# Patient Record
Sex: Female | Born: 1960 | Race: White | Hispanic: No | Marital: Married | Smoking: Former smoker
Health system: Southern US, Community
[De-identification: ages and names within clinical notes are randomized; demographics above are authoritative.]

## PROBLEM LIST (undated history)

## (undated) HISTORY — PX: ABDOMINAL HERNIA REPAIR: SHX539

---

## 1999-07-18 ENCOUNTER — Emergency Department (HOSPITAL_COMMUNITY): Admission: EM | Admit: 1999-07-18 | Discharge: 1999-07-18 | Payer: Self-pay | Admitting: Emergency Medicine

## 2001-06-26 ENCOUNTER — Other Ambulatory Visit: Admission: RE | Admit: 2001-06-26 | Discharge: 2001-06-26 | Payer: Self-pay | Admitting: Gynecology

## 2002-10-29 ENCOUNTER — Other Ambulatory Visit: Admission: RE | Admit: 2002-10-29 | Discharge: 2002-10-29 | Payer: Self-pay | Admitting: Gynecology

## 2003-04-09 ENCOUNTER — Encounter: Admission: RE | Admit: 2003-04-09 | Discharge: 2003-04-09 | Payer: Self-pay | Admitting: Family Medicine

## 2003-04-09 ENCOUNTER — Encounter: Payer: Self-pay | Admitting: Family Medicine

## 2004-03-26 ENCOUNTER — Encounter: Admission: RE | Admit: 2004-03-26 | Discharge: 2004-03-26 | Payer: Self-pay | Admitting: Family Medicine

## 2004-11-01 ENCOUNTER — Other Ambulatory Visit: Admission: RE | Admit: 2004-11-01 | Discharge: 2004-11-01 | Payer: Self-pay | Admitting: Gynecology

## 2005-01-28 IMAGING — CT CT HEAD WO/W CM
3 of 4 series · 17 of 30 positions shown, 19 images · IV contrast ([ID] OMNI 300)
Comparison: none

CLINICAL DATA: 42-year-old female with headaches and left facial weakness. 
CT HEAD WITH AND WITHOUT CONTRAST
TECHNIQUE: Pre- and post-contrast images were performed of the head in the axial plane with 100 cc Omnipaque 300 administered intravenous for the post-contrast scanning.  
  No acute mass effect, hemorrhage, herniation, hydrocephalus, midline shift, or extra-axial fluid collection.  The ventricles are symmetric.  The cisterns are patent.  Normal vascular enhancement.  The visualized sinuses and mastoid air cells are clear. 
IMPRESSION
No acute intracranial abnormality or abnormal enhancement.

[Series 2: brain · axial · 0.49mm/px · z∈[+69,+162]mm · 5 of 28 slices shown (1 of 3)]
[im 5/28  brain]
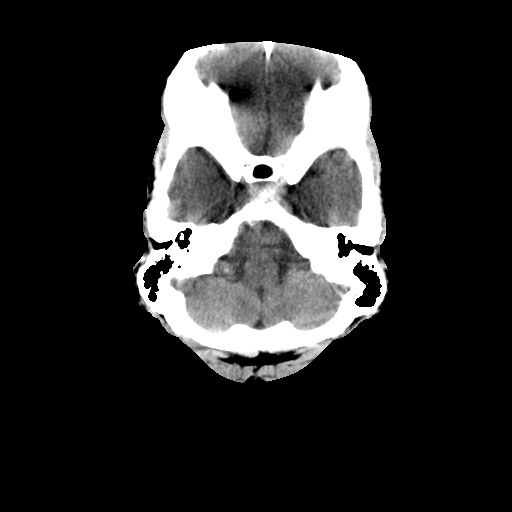
[im 10/28  brain]
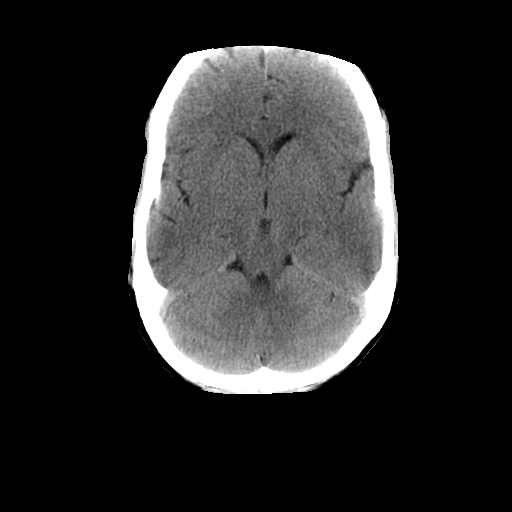
[im 14/28  brain]
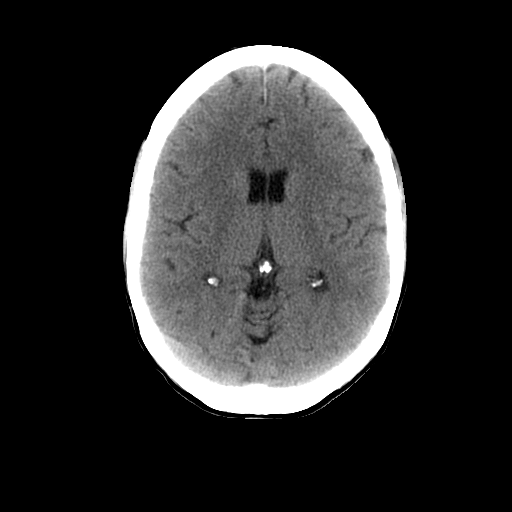
[im 19/28  brain]
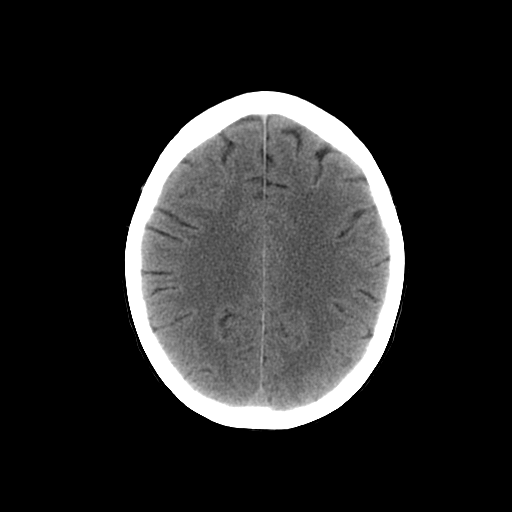
[im 23/28  brain]
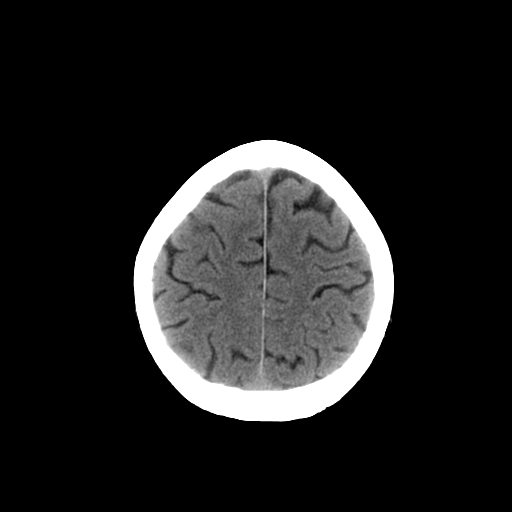

[Series 3: brain · axial · 0.49mm/px · z∈[+63,+168]mm · 6 of 28 slices shown, 8 images (2 of 3)]
[im 4/28  brain]
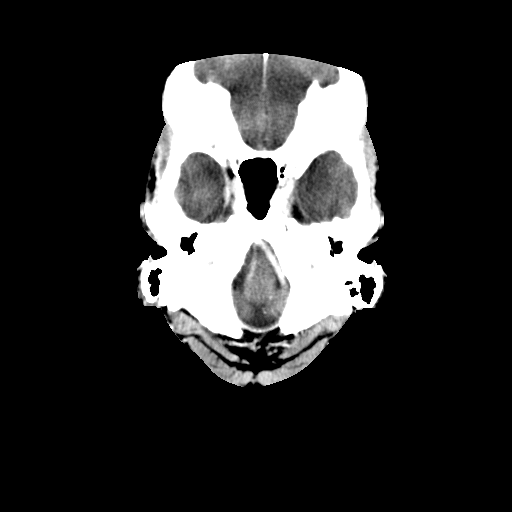
[im 4/28  bone]
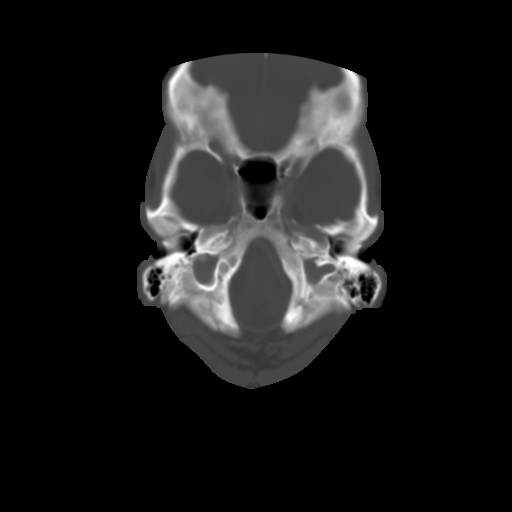
[im 8/28  brain]
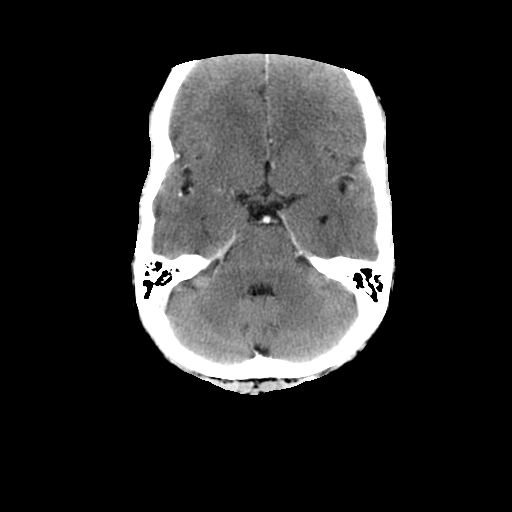
[im 12/28  brain]
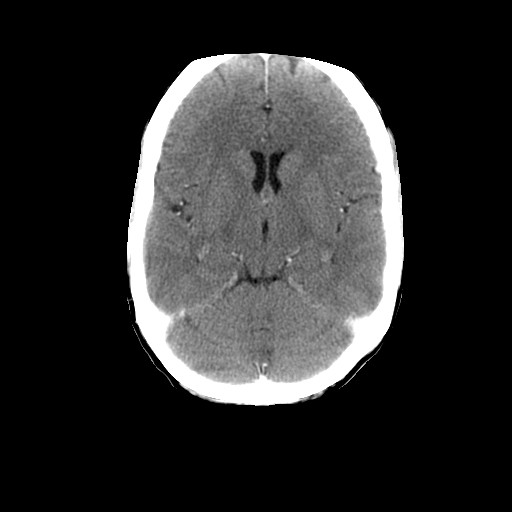
[im 16/28  brain]
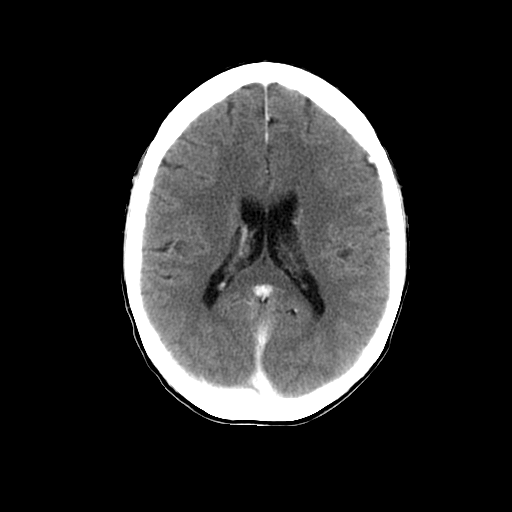
[im 20/28  brain]
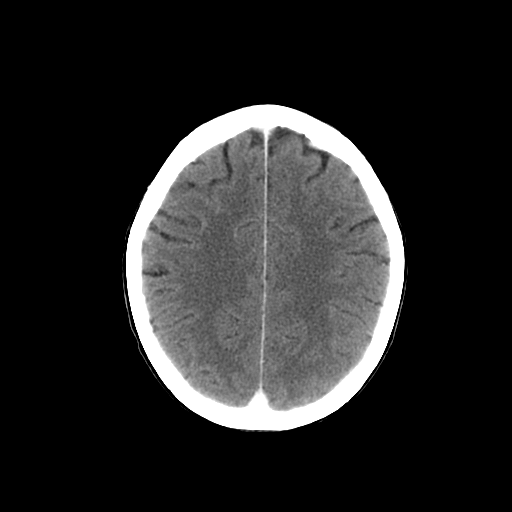
[im 20/28  bone]
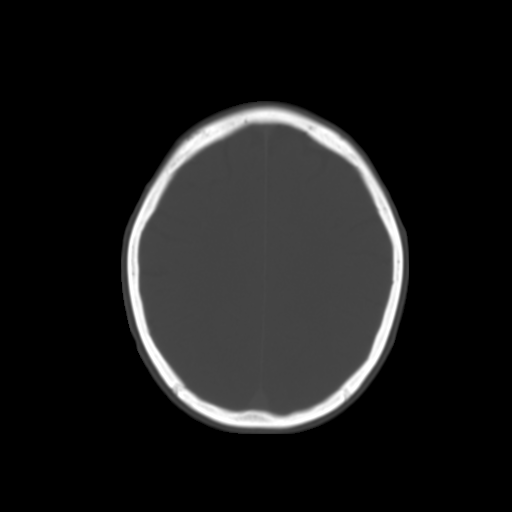
[im 24/28  brain]
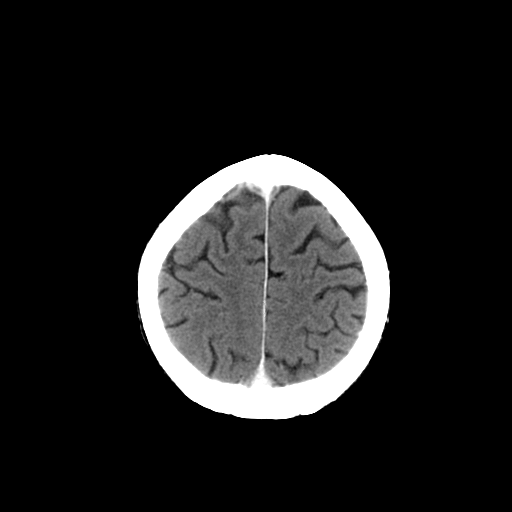

[Series 6: brain · axial · 0.49mm/px · z∈[+57,+159]mm · 6 of 28 slices shown (3 of 3)]
[im 4/28  brain]
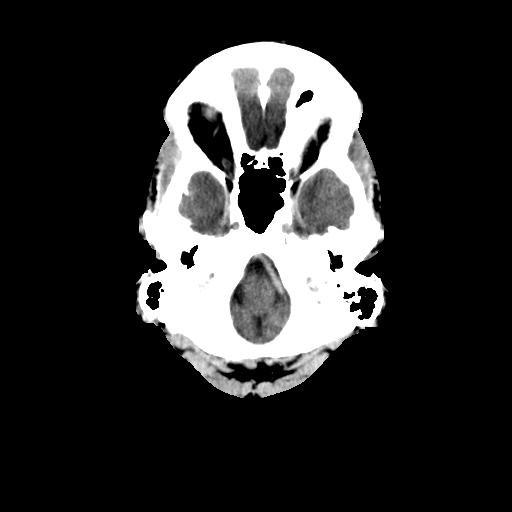
[im 8/28  brain]
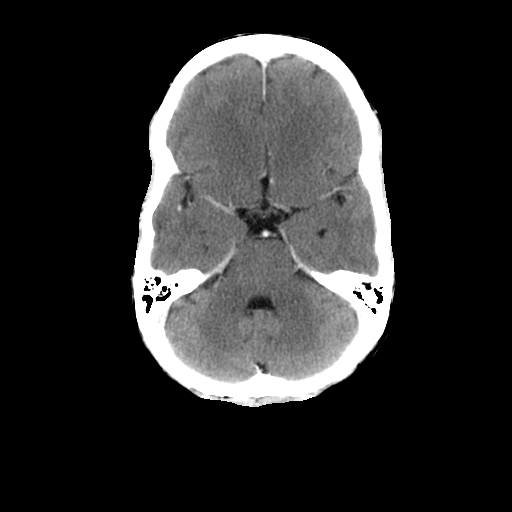
[im 12/28  brain]
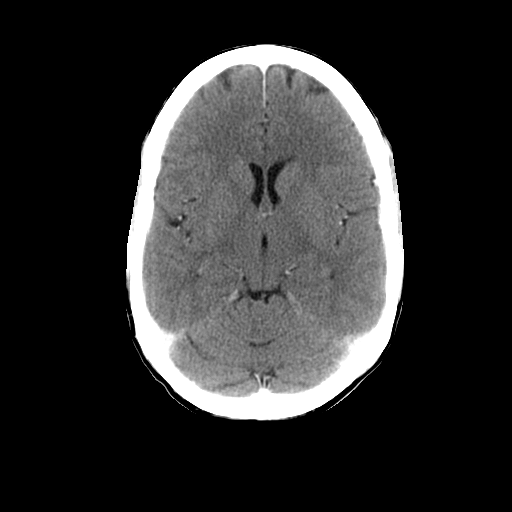
[im 16/28  brain]
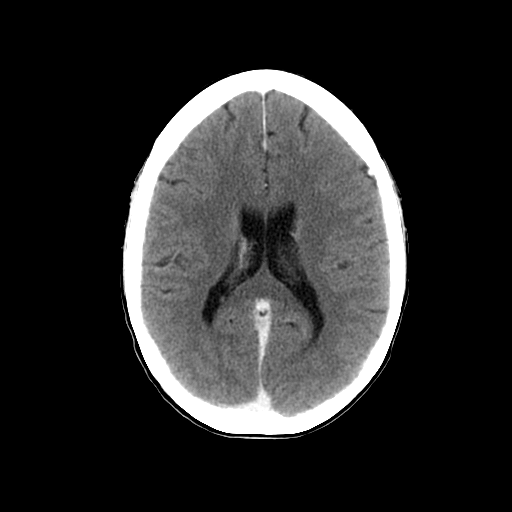
[im 20/28  brain]
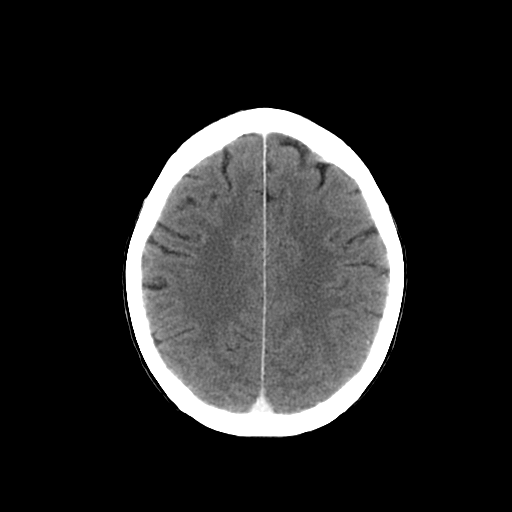
[im 24/28  brain]
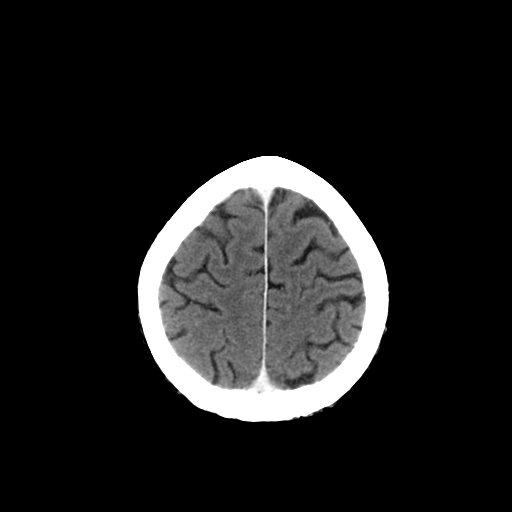

[17 of 30 positions shown; findings below may reference images not displayed]

## 2005-02-04 ENCOUNTER — Encounter: Admission: RE | Admit: 2005-02-04 | Discharge: 2005-02-04 | Payer: Self-pay | Admitting: Family Medicine

## 2005-10-06 ENCOUNTER — Other Ambulatory Visit: Admission: RE | Admit: 2005-10-06 | Discharge: 2005-10-06 | Payer: Self-pay | Admitting: Gynecology

## 2006-10-10 ENCOUNTER — Other Ambulatory Visit: Admission: RE | Admit: 2006-10-10 | Discharge: 2006-10-10 | Payer: Self-pay | Admitting: Gynecology

## 2006-11-24 ENCOUNTER — Ambulatory Visit (HOSPITAL_BASED_OUTPATIENT_CLINIC_OR_DEPARTMENT_OTHER): Admission: RE | Admit: 2006-11-24 | Discharge: 2006-11-24 | Payer: Self-pay | Admitting: Gynecology

## 2006-11-24 ENCOUNTER — Encounter (INDEPENDENT_AMBULATORY_CARE_PROVIDER_SITE_OTHER): Payer: Self-pay | Admitting: Specialist

## 2008-07-09 ENCOUNTER — Ambulatory Visit: Payer: Self-pay | Admitting: Vascular Surgery

## 2008-07-30 ENCOUNTER — Ambulatory Visit: Payer: Self-pay | Admitting: Vascular Surgery

## 2008-10-22 ENCOUNTER — Ambulatory Visit: Payer: Self-pay | Admitting: Vascular Surgery

## 2008-12-17 ENCOUNTER — Ambulatory Visit: Payer: Self-pay | Admitting: Vascular Surgery

## 2011-02-08 NOTE — Assessment & Plan Note (Signed)
OFFICE VISIT   RAHIMA, FLEISHMAN I  DOB:  1961-04-26                                       10/22/2008  EAVWU#:[id number]   The patient presents today for repeat second session of sclerotherapy.  She did well with the first session and has noted improvement in the  areas treated.  We reviewed her old pictures.  She does have scattered  telangiectasia over both thighs, popliteal space and calves bilaterally.  She identified the areas of most concern and we administered a total of  4 mL of 0.3% sodium tetradecyl mixed 50/50 with CO2.  She tolerated the  procedure without immediate complications.  Her stockings were placed  and she was discharged to home.   Larina Earthly, M.D.  Electronically Signed   TFE/MEDQ  D:  10/22/2008  T:  10/23/2008  Job:  [id number]

## 2011-02-08 NOTE — Assessment & Plan Note (Signed)
OFFICE VISIT   Jasmine Boyd, Jasmine Boyd  DOB:  1961/01/19                                       12/17/2008  ZOXWR#:[id number]   The patient presents today for continued treatment of spider vein  telangiectasia.  She has had a very good result so far with the raised  reticular veins completely treated and gone.  She does have several  additional areas of small spider vein telangiectasia on both thighs, one  small area on her right buttock and both calves that she wished to be  retreated and this was done today with 2 mL of 0.3% sodium tetradecyl  mixed 50% with CO2.  She had no immediate complications and will be seen  on an as-needed basis.   Larina Earthly, M.D.  Electronically Signed   TFE/MEDQ  D:  12/17/2008  T:  12/18/2008  Job:  [id number]

## 2011-02-08 NOTE — Assessment & Plan Note (Signed)
OFFICE VISIT   Jasmine, Boyd I  DOB:  1961-06-14                                       07/30/2008  ZOXWR#:[id number]   She had sclerotherapy bilaterally.  This was done with sodium tetradecyl  0.3% with mix 50/50 with CO2 air for foam sclerotherapy.  The focus was  on both lateral thighs.  She did have reticular branches feeding  subcutaneous small telangiectasia.  These were treated as were the small  telangiectasia and also an area of larger reticulars behind her right  knee.  She had no immediate complications, was placed in compression  garments and I will see her again in 3 weeks for continued followup.   Larina Earthly, M.D.  Electronically Signed   TFE/MEDQ  D:  07/30/2008  T:  07/31/2008  Job:  [id number]

## 2011-02-08 NOTE — Consult Note (Signed)
NEW PATIENT CONSULTATION   Jasmine Boyd, Jasmine Boyd  DOB:  [birth year]                                       07/09/2008  ZDGUY#:[id number]   The patient presents today for evaluation of bilateral spider vein  telangiectasia.  She has no prior history of varicose veins, deep venous  thrombosis or chronic venous insufficiency.  She has had prior treatment  for sclerotherapy for spider vein telangiectasia in the past.  It has  been quite a few years and she does have a recurrence of new  telangiectasia.  She does report an aching sensation in both lower  extremities with prolonged sitting or standing, specifically over the  spider veins.  She does not have any history of bleeding or hemorrhage.  She does not wear compression garments and does not elevate her legs  frequently.   PAST MEDICAL HISTORY:  Significant for anemia, otherwise completely  negative.  She has no allergies and is on no medications.  She has had 2  pregnancies.   PHYSICAL EXAM:  A well-developed, well-nourished white female appearing  stated age of 62.  Her blood pressure 108/63, pulse 63, respirations 18.  Her dorsalis pedis pulses are 2+ bilaterally.  She does have scattered  spider vein telangiectasia most prominently over the lateral aspects of  both thighs.  She underwent handheld venous duplex by me for a screening  study and this showed normal caliber saphenous veins bilaterally with no  evidence of gross reflux.  Boyd discussed the significance of her  telangiectasia with the patient.  Boyd explained it does not indicate any  more serious complications of venous hypertension.  Boyd have explained the  cosmetic treatment of these with sodium tetradecyl injection for  sclerotherapy.  Boyd explained the variability of treatment outcomes.  When  looking with the high intensity light, she does appear to have some  small feeding veins into the spider veins and Boyd explained that typically  injecting into these  areas can allow a good treatment of over a large  segment.  She understands and wishes to proceed with sclerotherapy.  Boyd  did explain that she would require graduated compression garments for 48  hours following the sclerotherapy and then 2 weeks daytime today.  She  will notify us when she wishes to proceed with treatment.   Larina Earthly, M.D.  Electronically Signed   TFE/MEDQ  D:  07/09/2008  T:  07/10/2008  Job:  [id number]

## 2011-02-11 NOTE — Op Note (Signed)
Jasmine Boyd, Jasmine Boyd           ACCOUNT NO.:  [id number]   MEDICAL RECORD NO.:  [id number]          PATIENT TYPE:  AMB   LOCATION:  [facility]                         FACILITY:  [facility]   PHYSICIAN:  Gretta Cool, M.D. DATE OF BIRTH:  Oct 10, 1960   DATE OF PROCEDURE:  11/24/2006  DATE OF DISCHARGE:                               OPERATIVE REPORT   PREOPERATIVE DIAGNOSIS:  Abnormal uterine bleeding unresponsive to  conservative therapy.   POSTOPERATIVE DIAGNOSIS:  Abnormal uterine bleeding unresponsive to  conservative therapy.   PROCEDURE:  1. Hysteroscopy.  2. Resection and total ablation.  3. VaporTrode.   SURGEON:  Beather Arbour, MD   ANESTHESIA:  IV sedation and paracervical block.   DESCRIPTION OF PROCEDURE:  Under excellent anesthesia as above, with the  patient prepped and draped in lithotomy position and Allen stirrups, the  cervix was grasped with a single-tooth tenaculum and progressively  dilated with a series of Pratt dilators to accommodate the 7 mm  resectoscope.  The resectoscope was then introduced and the endometrial  cavity photographed.  There was no evidence of polyp or fibroid as known  previously by ultrasound.  The entire endometrial cavity was then  resected down approximately 5 mm into the endometrial cavity 360 degrees  around the cavity.  The cornual areas were resected deep until no  significant further endometrial tissue could be identified.  VaporTrode  was then used to further ablate the cornual areas, and then the entire  endometrial cavity was treated by VaporTrode so as to eliminate any  viable islands of endometrium and superficial myometrium.  Once the  entire endometrial cavity was treated, the pressure was reduced, and  there was no significant reduced pressure.  At this point, the procedure  was terminated without complication, and the patient returned to the  recovery room in excellent condition.  Fluid deficit approximately 340   mL.           ______________________________  Gretta Cool, M.D.     CWL/MEDQ  D:  11/24/2006  T:  11/24/2006  Job:  [id number]

## 2011-02-15 ENCOUNTER — Other Ambulatory Visit: Payer: Self-pay | Admitting: Gynecology

## 2011-12-16 ENCOUNTER — Other Ambulatory Visit: Payer: Self-pay | Admitting: Dermatology

## 2011-12-27 ENCOUNTER — Encounter: Payer: Self-pay | Admitting: *Deleted

## 2011-12-28 ENCOUNTER — Encounter: Payer: Self-pay | Admitting: *Deleted

## 2011-12-28 ENCOUNTER — Ambulatory Visit (INDEPENDENT_AMBULATORY_CARE_PROVIDER_SITE_OTHER): Payer: Self-pay | Admitting: *Deleted

## 2011-12-28 DIAGNOSIS — I781 Nevus, non-neoplastic: Secondary | ICD-10-CM

## 2011-12-28 NOTE — Progress Notes (Signed)
X=>3% Sotradecol administered with a 27g butterfly.  Patient received a total of 12cc. Retreated all areas of concern to clear up remaining vessels. Tol well except she felt faint afterwards which happens when she gives blood. Gave her water and crackers and put he legs up. Pt ambulated out after a few minutes. Will follow prn. Very easy access and antic good results.  Photos: yes  Compression stockings applied: yes

## 2012-03-05 ENCOUNTER — Other Ambulatory Visit: Payer: Self-pay | Admitting: Gynecology

## 2012-08-10 ENCOUNTER — Ambulatory Visit (INDEPENDENT_AMBULATORY_CARE_PROVIDER_SITE_OTHER): Payer: Self-pay | Admitting: *Deleted

## 2012-08-10 ENCOUNTER — Encounter: Payer: Self-pay | Admitting: Vascular Surgery

## 2012-08-10 DIAGNOSIS — I781 Nevus, non-neoplastic: Secondary | ICD-10-CM

## 2012-08-10 NOTE — Progress Notes (Signed)
X=.3% Sotradecol administered with a 27g butterfly.  Patient received a total of 5cc.  Cleaned up areas that had a few remaining spiders. She has healed well and is happy with results. Will follow prn.  Cutaneous Laser:pulsed mode  2.6 watts treated a few red spots on her chest and stomach. Tol well.  Photos: yes  Compression stockings applied: yes

## 2014-07-23 ENCOUNTER — Other Ambulatory Visit: Payer: Self-pay | Admitting: Dermatology

## 2015-11-30 ENCOUNTER — Encounter: Payer: Self-pay | Admitting: *Deleted

## 2015-12-03 ENCOUNTER — Ambulatory Visit (INDEPENDENT_AMBULATORY_CARE_PROVIDER_SITE_OTHER): Payer: BLUE CROSS/BLUE SHIELD | Admitting: *Deleted

## 2015-12-03 DIAGNOSIS — I781 Nevus, non-neoplastic: Secondary | ICD-10-CM

## 2015-12-03 NOTE — Progress Notes (Signed)
X=.3% Sotradecol administered with a 27g butterfly.  Patient received a total of 6cc.  Cutaneous Laser:pulsed mode  810j/cm2 400 ms delay  13 ms Duration 0.5 spot  Total pulses: 846 Total energy 1.342  Total time::10  Photos: No.  Compression stockings applied: Yes.     Treated her spider veins with a combo of sclero and CL. Tol well. Will follow prn.

## 2017-07-26 ENCOUNTER — Ambulatory Visit (INDEPENDENT_AMBULATORY_CARE_PROVIDER_SITE_OTHER): Payer: BLUE CROSS/BLUE SHIELD | Admitting: Allergy and Immunology

## 2017-07-26 ENCOUNTER — Encounter: Payer: Self-pay | Admitting: Allergy and Immunology

## 2017-07-26 VITALS — BP 108/76 | HR 60 | Temp 97.6°F | Resp 18 | Ht 64.0 in | Wt 121.2 lb

## 2017-07-26 DIAGNOSIS — Z91018 Allergy to other foods: Secondary | ICD-10-CM | POA: Diagnosis not present

## 2017-07-26 DIAGNOSIS — L500 Allergic urticaria: Secondary | ICD-10-CM | POA: Diagnosis not present

## 2017-07-26 DIAGNOSIS — L2389 Allergic contact dermatitis due to other agents: Secondary | ICD-10-CM | POA: Diagnosis not present

## 2017-07-26 MED ORDER — EPINEPHRINE 0.3 MG/0.3ML IJ SOAJ: 0.3000 mg | Freq: Once | INTRAMUSCULAR | 1 refills | Status: DC

## 2017-07-26 MED ORDER — MOMETASONE FUROATE 0.1 % EX OINT: TOPICAL_OINTMENT | CUTANEOUS | 3 refills | Status: AC

## 2017-07-26 MED ORDER — PIMECROLIMUS 1 % EX CREA: TOPICAL_CREAM | CUTANEOUS | 3 refills | Status: AC

## 2017-07-26 NOTE — Progress Notes (Signed)
Dear Dr. Dagmar Hait,  Thank you for referring Jasmine Boyd to the [facility] on 07/26/2017.   Below is a summation of this patient's evaluation and recommendations.  Thank you for your referral. I will keep you informed about this patient's response to treatment.   If you have any questions please do not hesitate to contact me.   Sincerely,  Jiles Prows, MD Allergy / Immunology [facility]   ______________________________________________________________________    NEW PATIENT NOTE  Referring Provider: Prince Solian, MD Primary Provider: Prince Solian, MD Date of office visit: 07/26/2017    Subjective:   Chief Complaint:  Jasmine Boyd (DOB: 02/27/1961) is a 56 y.o. female who presents to the clinic on 07/26/2017 with a chief complaint of Allergic Reaction (Possible reaction after eating shrimp.) .     HPI: Jasmine Boyd presents to this clinic in evaluation of 2 main issues.  First, in October 2017 she ate a gumbo soup and developed itchy head. The next day she consumed more gumbo soup and developed diffuse urticaria with red raised itchy lesions across her body that never healed with scar or hyperpigmentation and was not associated with any systemic or constitutional symptoms and lasted approximately 5 days. Since that point in time she has eaten scallops and muscles and fish with no problem but remains away from other forms of shellfish.  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 She has tried gloves which may make her hands worse. She wears finger cot's which may help slightly. She has been given 3 different types of topical agents which she uses intermittently about twice a week and mainly relies on the use of Aquaphor. She does have exposure to dish wash soap while working as a Psychiatric nurse. She  may have a small spot that affects her posterior neck and possibly under her lower lip but predominantly this issue involves her hands. There has not really been any other additional environmental change other than the fact that she has been working in a [employer] and has exposure to plants and dish wash soap.  She also has a history of very mild sneezing when getting exposed to dust which is a minimal problem and does not require any therapy.  History reviewed. No pertinent past medical history.  Past Surgical History:  Procedure Laterality Date  . ABDOMINAL HERNIA REPAIR      Allergies as of 07/26/2017   No Known Allergies     Medication List      augmented betamethasone dipropionate 0.05 % cream Commonly known as:  DIPROLENE-AF   fluticasone 0.005 % ointment Commonly known as:  CUTIVATE   triamcinolone cream 0.1 % Commonly known as:  KENALOG       Review of systems negative except as noted in HPI / PMHx or noted below:  Review of Systems  Constitutional: Negative.   HENT: Negative.   Eyes: Negative.   Respiratory: Negative.   Cardiovascular: Negative.   Gastrointestinal: Negative.   Genitourinary: Negative.   Musculoskeletal: Negative.   Skin: Negative.   Neurological: Negative.   Endo/Heme/Allergies: Negative.   Psychiatric/Behavioral: Negative.     Family History  Problem Relation Age of Onset  . Asthma Father   . Lung cancer Father   . Food Allergy Sister  Shrimp allergy  . Food Allergy Brother        Shrimp allergy    Social History   Social History  . Marital status: Married    Spouse name: N/A  . Number of children: N/A  . Years of education: N/A   Occupational History  . Not on file.   Social History Main Topics  . Smoking status: Never Smoker  . Smokeless tobacco: Never Used  . Alcohol use Yes  . Drug use: No  . Sexual activity: Not on file   Other Topics Concern  . Not on file   Social History Narrative  . No narrative  on file    Environmental and Social history  Lives in a house with a dry environment, a dog located inside the household, no carpeting in the bedroom, no plastic on the bed, no plastic on the pillow, and employment as a Psychiatric nurse.  Objective:   Vitals:   07/26/17 0916  BP: 108/76  Pulse: 60  Resp: 18  Temp: 97.6 F (36.4 C)   Height: 5\' 4"  (162.6 cm) Weight: 121 lb 3.2 oz (55 kg)  Physical Exam  Constitutional: She is well-developed, well-nourished, and in no distress.  HENT:  Head: Normocephalic. Head is without right periorbital erythema and without left periorbital erythema.  Right Ear: Tympanic membrane, external ear and ear canal normal.  Left Ear: Tympanic membrane, external ear and ear canal normal.  Nose: Nose normal. No mucosal edema or rhinorrhea.  Mouth/Throat: Uvula is midline, oropharynx is clear and moist and mucous membranes are normal. No oropharyngeal exudate.  Eyes: Pupils are equal, round, and reactive to light. Conjunctivae and lids are normal.  Neck: Trachea normal. No tracheal tenderness present. No tracheal deviation present. No thyromegaly present.  Cardiovascular: Normal rate, regular rhythm, S1 normal, S2 normal and normal heart sounds.   No murmur heard. Pulmonary/Chest: Effort normal and breath sounds normal. No stridor. No tachypnea. No respiratory distress. She has no wheezes. She has no rales. She exhibits no tenderness.  Abdominal: Soft. She exhibits no distension and no mass. There is no hepatosplenomegaly. There is no tenderness. There is no rebound and no guarding.  Musculoskeletal: She exhibits no edema or tenderness.  Lymphadenopathy:       Head (right side): No tonsillar adenopathy present.       Head (left side): No tonsillar adenopathy present.    She has no cervical adenopathy.    She has no axillary adenopathy.  Neurological: She is alert. Gait normal.  Skin: Rash (Several patches of lichenified scaly skin with slight erythema affecting  fingers and hands bilaterally with one patch affecting right wrist medially.) noted. She is not diaphoretic. No erythema. No pallor. Nails show no clubbing.  Psychiatric: Mood and affect normal.    Diagnostics: Allergy skin tests were performed. She did not demonstrate any hypersensitivity against a screening panel of fish or shellfish.  Assessment and Plan:    1. Allergic contact dermatitis due to other agents   2. Allergic urticaria   3. Food allergy     1. Allergen avoidance measures?  2. Treat and prevent inflammation: Use a combination of Elidel followed by mometasone 0.1% ointment twice a day to eczema until resolved. Then find a preventative dose of medication used 1-7 times per week  3. Blood - shellfish panel, fish panel  4. Epinephrine autoinjector device?  5. In clinic food challenge?  6. Return to clinic Christmas 2018 or earlier if problem  Concerning Beth's reactivity  to shellfish and fish I think we need to look at the titers of IgE antibodies directed against these food products and make a decision about providing her an in clinic food challenge to these foods or providing her a epinephrine autoinjector. I will contact her once the results of those blood tests are available for review. Concerning her contact dermatitis, I am not sure that patch testing her is going to help very much as she is not going to change her occupation or exposure at this point in time. Rather, we will just focus on aggressive treatment of skin inflammation as noted above. I will regroup with her in 8 weeks to assess her response to this approach.  Jiles Prows, MD Allergy / Immunology [facility]

## 2017-07-26 NOTE — Patient Instructions (Addendum)
  1. Allergen avoidance measures?  2. Treat and prevent inflammation: Use a combination of Elidel followed by mometasone 0.1% ointment twice a day to eczema until resolved. Then find a preventative dose of medication used 1-7 times per week  3. Blood - shellfish panel, fish panel  4. Epinephrine autoinjector device?  5. In clinic food challenge?  6. Return to clinic Christmas 2018 or earlier if problem

## 2017-07-29 LAB — ALLERGEN PROFILE, FOOD-FISH
Allergen Walley Pike IgE: <0.1 kU/L
Codfish IgE: <0.1 kU/L
Tuna: <0.1 kU/L

## 2017-07-29 LAB — ALLERGEN PROFILE, SHELLFISH
F080-IgE Lobster: <0.1 kU/L
Shrimp IgE: <0.1 kU/L

## 2017-10-11 ENCOUNTER — Ambulatory Visit: Payer: BLUE CROSS/BLUE SHIELD | Admitting: *Deleted

## 2017-10-11 ENCOUNTER — Encounter: Payer: Self-pay | Admitting: *Deleted

## 2017-10-11 ENCOUNTER — Ambulatory Visit (INDEPENDENT_AMBULATORY_CARE_PROVIDER_SITE_OTHER): Payer: Self-pay | Admitting: *Deleted

## 2017-10-11 DIAGNOSIS — I781 Nevus, non-neoplastic: Secondary | ICD-10-CM

## 2017-10-11 NOTE — Progress Notes (Signed)
X=.3% Sotradecol administered with a 27g butterfly.  Patient received a total of 6cc.  Treated majority of her concerns with one syringe. Easy access. Tol well. Anticipate good results. Follow prn.  Photos: No.  Compression stockings applied: Yes.

## 2019-07-02 ENCOUNTER — Other Ambulatory Visit: Payer: Self-pay

## 2019-07-02 DIAGNOSIS — Z20822 Contact with and (suspected) exposure to covid-19: Secondary | ICD-10-CM

## 2019-07-04 LAB — NOVEL CORONAVIRUS, NAA: SARS-CoV-2, NAA: NOT DETECTED

## 2019-10-30 ENCOUNTER — Ambulatory Visit: Payer: BC Managed Care – PPO | Attending: Internal Medicine

## 2019-10-30 DIAGNOSIS — Z20822 Contact with and (suspected) exposure to covid-19: Secondary | ICD-10-CM | POA: Insufficient documentation

## 2019-10-31 LAB — NOVEL CORONAVIRUS, NAA: SARS-CoV-2, NAA: NOT DETECTED

## 2019-12-18 ENCOUNTER — Ambulatory Visit: Payer: Self-pay | Attending: Internal Medicine

## 2019-12-18 DIAGNOSIS — Z20822 Contact with and (suspected) exposure to covid-19: Secondary | ICD-10-CM

## 2019-12-19 LAB — SARS-COV-2, NAA 2 DAY TAT

## 2019-12-19 LAB — NOVEL CORONAVIRUS, NAA: SARS-CoV-2, NAA: NOT DETECTED

## 2019-12-25 ENCOUNTER — Ambulatory Visit: Payer: Self-pay | Attending: Internal Medicine

## 2020-02-10 ENCOUNTER — Ambulatory Visit: Payer: BC Managed Care – PPO | Attending: Internal Medicine

## 2020-02-10 DIAGNOSIS — Z20822 Contact with and (suspected) exposure to covid-19: Secondary | ICD-10-CM | POA: Insufficient documentation

## 2020-02-11 LAB — NOVEL CORONAVIRUS, NAA: SARS-CoV-2, NAA: NOT DETECTED

## 2020-02-11 LAB — SARS-COV-2, NAA 2 DAY TAT

## 2020-07-21 ENCOUNTER — Encounter: Payer: Self-pay | Admitting: Gastroenterology

## 2020-08-11 ENCOUNTER — Other Ambulatory Visit: Payer: Self-pay

## 2020-08-11 ENCOUNTER — Ambulatory Visit (AMBULATORY_SURGERY_CENTER): Payer: Self-pay

## 2020-08-11 VITALS — Ht 64.0 in | Wt 117.0 lb

## 2020-08-11 DIAGNOSIS — Z1211 Encounter for screening for malignant neoplasm of colon: Secondary | ICD-10-CM

## 2020-08-11 MED ORDER — SUTAB 1479-225-188 MG PO TABS: 12.0000 | ORAL_TABLET | ORAL | 0 refills | Status: DC

## 2020-08-11 NOTE — Progress Notes (Signed)
No allergies to soy or egg Pt is not on blood thinners or diet pills Denies issues with sedation/intubation Denies atrial flutter/fib Denies constipation   Pt is aware of Covid safety and care partner requirements.      

## 2020-08-12 ENCOUNTER — Encounter: Payer: Self-pay | Admitting: Gastroenterology

## 2020-09-03 ENCOUNTER — Other Ambulatory Visit: Payer: Self-pay

## 2020-09-03 ENCOUNTER — Ambulatory Visit (AMBULATORY_SURGERY_CENTER): Payer: BC Managed Care – PPO | Admitting: Gastroenterology

## 2020-09-03 ENCOUNTER — Encounter: Payer: Self-pay | Admitting: Gastroenterology

## 2020-09-03 VITALS — BP 124/56 | HR 57 | Temp 97.6°F | Resp 14 | Ht 64.0 in | Wt 117.0 lb

## 2020-09-03 DIAGNOSIS — D120 Benign neoplasm of cecum: Secondary | ICD-10-CM | POA: Diagnosis not present

## 2020-09-03 DIAGNOSIS — D122 Benign neoplasm of ascending colon: Secondary | ICD-10-CM | POA: Diagnosis not present

## 2020-09-03 DIAGNOSIS — Z1211 Encounter for screening for malignant neoplasm of colon: Secondary | ICD-10-CM

## 2020-09-03 MED ORDER — SODIUM CHLORIDE 0.9 % IV SOLN: 500.0000 mL | Freq: Once | INTRAVENOUS | Status: DC

## 2020-09-03 NOTE — Progress Notes (Signed)
A/ox3, pleased with MAC, report to RN 

## 2020-09-03 NOTE — Progress Notes (Signed)
Pt's states no medical or surgical changes since previsit or office visit. 

## 2020-09-03 NOTE — Patient Instructions (Signed)
YOU HAD AN ENDOSCOPIC PROCEDURE TODAY AT THE [facility]:   Refer to the procedure report that was given to you for any specific questions about what was found during the examination.  If the procedure report does not answer your questions, please call your gastroenterologist to clarify.  If you requested that your care partner not be given the details of your procedure findings, then the procedure report has been included in a sealed envelope for you to review at your convenience later.  YOU SHOULD EXPECT: Some feelings of bloating in the abdomen. Passage of more gas than usual.  Walking can help get rid of the air that was put into your GI tract during the procedure and reduce the bloating. If you had a lower endoscopy (such as a colonoscopy or flexible sigmoidoscopy) you may notice spotting of blood in your stool or on the toilet paper. If you underwent a bowel prep for your procedure, you may not have a normal bowel movement for a few days.  Please Note:  You might notice some irritation and congestion in your nose or some drainage.  This is from the oxygen used during your procedure.  There is no need for concern and it should clear up in a day or so.  SYMPTOMS TO REPORT IMMEDIATELY:   Following lower endoscopy (colonoscopy or flexible sigmoidoscopy):  Excessive amounts of blood in the stool  Significant tenderness or worsening of abdominal pains  Swelling of the abdomen that is new, acute  Fever of 100F or higher  For urgent or emergent issues, a gastroenterologist can be reached at any hour by calling [phone]. Do not use MyChart messaging for urgent concerns.    DIET:  We do recommend a small meal at first, but then you may proceed to your regular diet.  Drink plenty of fluids but you should avoid alcoholic beverages for 24 hours.  ACTIVITY:  You should plan to take it easy for the rest of today and you should NOT DRIVE or use heavy machinery until tomorrow (because  of the sedation medicines used during the test).    FOLLOW UP: Our staff will call the number listed on your records 48-72 hours following your procedure to check on you and address any questions or concerns that you may have regarding the information given to you following your procedure. If we do not reach you, we will leave a message.  We will attempt to reach you two times.  During this call, we will ask if you have developed any symptoms of COVID 19. If you develop any symptoms (ie: fever, flu-like symptoms, shortness of breath, cough etc.) before then, please call [phone].  If you test positive for Covid 19 in the 2 weeks post procedure, please call and report this information to us.    If any biopsies were taken you will be contacted by phone or by letter within the next 1-3 weeks.  Please call us at [phone] if you have not heard about the biopsies in 3 weeks.    SIGNATURES/CONFIDENTIALITY: You and/or your care partner have signed paperwork which will be entered into your electronic medical record.  These signatures attest to the fact that that the information above on your After Visit Summary has been reviewed and is understood.  Full responsibility of the confidentiality of this discharge information lies with you and/or your care-partner. 

## 2020-09-03 NOTE — Progress Notes (Signed)
C.W. vital signs. 

## 2020-09-03 NOTE — Progress Notes (Signed)
Called to room to assist during endoscopic procedure.  Patient ID and intended procedure confirmed with present staff. Received instructions for my participation in the procedure from the performing physician.  

## 2020-09-03 NOTE — Op Note (Signed)
[place] Patient Name: Jasmine Boyd Procedure Date: 09/03/2020 11:13 AM MRN: [id number] Endoscopist: Mauri Pole , MD Age: 59 Referring MD:  Date of Birth: 1960-10-08 Gender: Female Account #: [id number] Procedure:                Colonoscopy Indications:              Screening for colorectal malignant neoplasm Medicines:                Monitored Anesthesia Care Procedure:                Pre-Anesthesia Assessment:                           - Prior to the procedure, a History and Physical                            was performed, and patient medications and                            allergies were reviewed. The patient's tolerance of                            previous anesthesia was also reviewed. The risks                            and benefits of the procedure and the sedation                            options and risks were discussed with the patient.                            All questions were answered, and informed consent                            was obtained. Prior Anticoagulants: The patient has                            taken no previous anticoagulant or antiplatelet                            agents. ASA Grade Assessment: II - A patient with                            mild systemic disease. After reviewing the risks                            and benefits, the patient was deemed in                            satisfactory condition to undergo the procedure.                           After obtaining informed consent, the colonoscope  was passed under direct vision. Throughout the                            procedure, the patient's blood pressure, pulse, and                            oxygen saturations were monitored continuously. The                            Colonoscope was introduced through the anus and                            advanced to the the cecum, identified by                            appendiceal  orifice and ileocecal valve. The                            colonoscopy was performed without difficulty. The                            patient tolerated the procedure well. The quality                            of the bowel preparation was excellent. The                            ileocecal valve, appendiceal orifice, and rectum                            were photographed. Scope In: 11:27:03 AM Scope Out: 11:41:35 AM Scope Withdrawal Time: 0 hours 6 minutes 6 seconds  Total Procedure Duration: 0 hours 14 minutes 32 seconds  Findings:                 The perianal and digital rectal examinations were                            normal.                           Two sessile polyps were found in the ascending                            colon and cecum. The polyps were 1 to 2 mm in size.                            These polyps were removed with a cold biopsy                            forceps. Resection and retrieval were complete.                           A few small-mouthed diverticula were found in the  sigmoid colon.                           Non-bleeding internal hemorrhoids were found during                            retroflexion. The hemorrhoids were small.                           The exam was otherwise without abnormality. Complications:            No immediate complications. Estimated Blood Loss:     Estimated blood loss was minimal. Impression:               - Two 1 to 2 mm polyps in the ascending colon and                            in the cecum, removed with a cold biopsy forceps.                            Resected and retrieved.                           - Diverticulosis in the sigmoid colon.                           - Non-bleeding internal hemorrhoids.                           - The examination was otherwise normal. Recommendation:           - Patient has a contact number available for                            emergencies. The signs and  symptoms of potential                            delayed complications were discussed with the                            patient. Return to normal activities tomorrow.                            Written discharge instructions were provided to the                            patient.                           - Resume previous diet.                           - Continue present medications.                           - Await pathology results.                           -  Repeat colonoscopy in 5-10 years for surveillance                            based on pathology results. Mauri Pole, MD 09/03/2020 11:46:07 AM This report has been signed electronically.

## 2020-09-07 ENCOUNTER — Telehealth: Payer: Self-pay | Admitting: *Deleted

## 2020-09-07 NOTE — Telephone Encounter (Signed)
1. Have you developed a fever since your procedure? no  2.   Have you had an respiratory symptoms (SOB or cough) since your procedure? no  3.   Have you tested positive for COVID 19 since your procedure no  4.   Have you had any family members/close contacts diagnosed with the COVID 19 since your procedure?  no   If yes to any of these questions please route to Joylene John, RN and Joella Prince, RN Follow up Call-  Call back number 09/03/2020  Post procedure Call Back phone  # [phone]  Permission to leave phone message Yes  Some recent data might be hidden     Patient questions:  Do you have a fever, pain , or abdominal swelling? No. Pain Score  0 *  Have you tolerated food without any problems? Yes.    Have you been able to return to your normal activities? Yes.    Do you have any questions about your discharge instructions: Diet   No. Medications  No. Follow up visit  No.  Do you have questions or concerns about your Care? No.  Actions: * If pain score is 4 or above: No action needed, pain <4.

## 2020-09-21 ENCOUNTER — Encounter: Payer: Self-pay | Admitting: Gastroenterology

## 2022-12-06 ENCOUNTER — Other Ambulatory Visit: Payer: Self-pay | Admitting: Internal Medicine

## 2022-12-06 DIAGNOSIS — E7800 Pure hypercholesterolemia, unspecified: Secondary | ICD-10-CM

## 2023-01-09 ENCOUNTER — Other Ambulatory Visit: Payer: BC Managed Care – PPO
